# Patient Record
Sex: Female | Born: 1995 | Race: Black or African American | Hispanic: No | Marital: Single | Smoking: Never smoker
Health system: Southern US, Community
[De-identification: ages and names within clinical notes are randomized; demographics above are authoritative.]

## PROBLEM LIST (undated history)

## (undated) DIAGNOSIS — R011 Cardiac murmur, unspecified: Secondary | ICD-10-CM

## (undated) DIAGNOSIS — M1990 Unspecified osteoarthritis, unspecified site: Secondary | ICD-10-CM

## (undated) HISTORY — PX: WISDOM TOOTH EXTRACTION: SHX21

---
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
## 2014-04-07 ENCOUNTER — Encounter (HOSPITAL_COMMUNITY): Payer: Self-pay | Admitting: Emergency Medicine

## 2014-04-07 ENCOUNTER — Emergency Department (HOSPITAL_COMMUNITY)
Admission: EM | Admit: 2014-04-07 | Discharge: 2014-04-07 | Disposition: A | Discharge status: Home / Self Care | Payer: No Typology Code available for payment source | Attending: Emergency Medicine | Admitting: Emergency Medicine

## 2014-04-07 DIAGNOSIS — Z3202 Encounter for pregnancy test, result negative: Secondary | ICD-10-CM | POA: Insufficient documentation

## 2014-04-07 DIAGNOSIS — Z79899 Other long term (current) drug therapy: Secondary | ICD-10-CM | POA: Insufficient documentation

## 2014-04-07 DIAGNOSIS — R011 Cardiac murmur, unspecified: Secondary | ICD-10-CM | POA: Insufficient documentation

## 2014-04-07 DIAGNOSIS — Z8739 Personal history of other diseases of the musculoskeletal system and connective tissue: Secondary | ICD-10-CM | POA: Insufficient documentation

## 2014-04-07 DIAGNOSIS — R109 Unspecified abdominal pain: Secondary | ICD-10-CM

## 2014-04-07 HISTORY — DX: Unspecified osteoarthritis, unspecified site: M19.90

## 2014-04-07 HISTORY — DX: Cardiac murmur, unspecified: R01.1

## 2014-04-07 LAB — CBC WITH DIFFERENTIAL/PLATELET
BASOS ABS: 0 10*3/uL (ref 0.0–0.1)
BASOS PCT: 0 % (ref 0–1)
EOS PCT: 1 % (ref 0–5)
Eosinophils Absolute: 0.1 10*3/uL (ref 0.0–0.7)
HEMATOCRIT: 36.2 % (ref 36.0–46.0)
Hemoglobin: 12.4 g/dL (ref 12.0–15.0)
Lymphocytes Relative: 23 % (ref 12–46)
Lymphs Abs: 1.8 10*3/uL (ref 0.7–4.0)
MCH: 31.1 pg (ref 26.0–34.0)
MCHC: 34.3 g/dL (ref 30.0–36.0)
MCV: 90.7 fL (ref 78.0–100.0)
MONO ABS: 0.6 10*3/uL (ref 0.1–1.0)
Monocytes Relative: 8 % (ref 3–12)
Neutro Abs: 5.3 10*3/uL (ref 1.7–7.7)
Neutrophils Relative %: 68 % (ref 43–77)
PLATELETS: 226 10*3/uL (ref 150–400)
RBC: 3.99 MIL/uL (ref 3.87–5.11)
RDW: 13.3 % (ref 11.5–15.5)
WBC: 7.9 10*3/uL (ref 4.0–10.5)

## 2014-04-07 LAB — URINALYSIS, ROUTINE W REFLEX MICROSCOPIC
Bilirubin Urine: NEGATIVE
GLUCOSE, UA: NEGATIVE mg/dL
Hgb urine dipstick: NEGATIVE
Ketones, ur: NEGATIVE mg/dL
LEUKOCYTES UA: NEGATIVE
Nitrite: NEGATIVE
PH: 7 (ref 5.0–8.0)
PROTEIN: NEGATIVE mg/dL
SPECIFIC GRAVITY, URINE: 1.021 (ref 1.005–1.030)
Urobilinogen, UA: 1 mg/dL (ref 0.0–1.0)

## 2014-04-07 LAB — COMPREHENSIVE METABOLIC PANEL
ALBUMIN: 4.1 g/dL (ref 3.5–5.2)
ALT: 14 U/L (ref 0–35)
AST: 18 U/L (ref 0–37)
Alkaline Phosphatase: 37 U/L — ABNORMAL LOW (ref 39–117)
BUN: 9 mg/dL (ref 6–23)
CALCIUM: 9.5 mg/dL (ref 8.4–10.5)
CO2: 26 mEq/L (ref 19–32)
CREATININE: 0.7 mg/dL (ref 0.50–1.10)
Chloride: 104 mEq/L (ref 96–112)
GFR calc Af Amer: >90 mL/min (ref >90)
Glucose, Bld: 98 mg/dL (ref 70–99)
Potassium: 4.4 mEq/L (ref 3.7–5.3)
SODIUM: 140 meq/L (ref 137–147)
TOTAL PROTEIN: 7.2 g/dL (ref 6.0–8.3)
Total Bilirubin: 0.3 mg/dL (ref 0.3–1.2)

## 2014-04-07 LAB — PREGNANCY, URINE: Preg Test, Ur: NEGATIVE

## 2014-04-07 MED ORDER — IBUPROFEN 600 MG PO TABS: 600.0000 mg | ORAL_TABLET | Freq: Four times a day (QID) | ORAL | Status: AC | PRN

## 2014-04-07 MED ORDER — METHOCARBAMOL 500 MG PO TABS: 500.0000 mg | ORAL_TABLET | Freq: Two times a day (BID) | ORAL | Status: AC

## 2014-04-07 NOTE — Discharge Instructions (Signed)
Abdominal Pain, Adult °Many things can cause abdominal pain. Usually, abdominal pain is not caused by a disease and will improve without treatment. It can often be observed and treated at home. Your health care provider will do a physical exam and possibly order blood tests and X-rays to help determine the seriousness of your pain. However, in many cases, more time must pass before a clear cause of the pain can be found. Before that point, your health care provider may not know if you need more testing or further treatment. °HOME CARE INSTRUCTIONS  °Monitor your abdominal pain for any changes. The following actions may help to alleviate any discomfort you are experiencing: °· Only take over-the-counter or prescription medicines as directed by your health care provider. °· Do not take laxatives unless directed to do so by your health care provider. °· Try a clear liquid diet (broth, tea, or water) as directed by your health care provider. Slowly move to a bland diet as tolerated. °SEEK MEDICAL CARE IF: °· You have unexplained abdominal pain. °· You have abdominal pain associated with nausea or diarrhea. °· You have pain when you urinate or have a bowel movement. °· You experience abdominal pain that wakes you in the night. °· You have abdominal pain that is worsened or improved by eating food. °· You have abdominal pain that is worsened with eating fatty foods. °SEEK IMMEDIATE MEDICAL CARE IF:  °· Your pain does not go away within 2 hours. °· You have a fever. °· You keep throwing up (vomiting). °· Your pain is felt only in portions of the abdomen, such as the right side or the left lower portion of the abdomen. °· You pass bloody or black tarry stools. °MAKE SURE YOU: °· Understand these instructions.   °· Will watch your condition.   °· Will get help right away if you are not doing well or get worse.   °Document Released: 08/23/2005 Document Revised: 09/03/2013 Document Reviewed: 07/23/2013 °[employer]® Patient  Information ©2014 [employer]. ° °

## 2014-04-07 NOTE — ED Notes (Signed)
Pt c.o abd pain x 2 weeks, states it started on the lower right side went up beside umbilicus. Pt states at times when she stands up it makes her right leg numb.

## 2014-04-07 NOTE — ED Provider Notes (Signed)
CSN: [id number]     Arrival date & time 04/07/14  1525 History   First MD Initiated Contact with Patient 04/07/14 1727     Chief Complaint  Patient presents with  . Abdominal Pain     (Consider location/radiation/quality/duration/timing/severity/associated sxs/prior Treatment) Patient is a 18 y.o. female presenting with abdominal pain. The history is provided by the patient.  Abdominal Pain  patient here complaining of right-sided abdominal pain x3 weeks. Pain characterized as sharp and worse with movement. Pain radiates down to her right leg. Does not have any associated vaginal bleeding or discharge. No urinary symptoms. No fever or chills. Denies any rashes to her flank area. No prior history of same. His over-the-counter medications without relief. Pain is better with remaining still. No change in her symptoms currently.  Past Medical History  Diagnosis Date  . Arthritis   . Heart murmur    Past Surgical History  Procedure Laterality Date  . Wisdom tooth extraction     No family history on file. History  Substance Use Topics  . Smoking status: Never Smoker   . Smokeless tobacco: Not on file  . Alcohol Use: No   OB History   Grav Para Term Preterm Abortions TAB SAB Ect Mult Living                 Review of Systems  Gastrointestinal: Positive for abdominal pain.  All other systems reviewed and are negative.     Allergies  Review of patient's allergies indicates no known allergies.  Home Medications   Prior to Admission medications   Medication Sig Start Date End Date Taking? Authorizing Provider  CRANBERRY EXTRACT PO Take 1 tablet by mouth 2 (two) times daily. [facility] brand   Yes Historical Provider, MD   BP 115/74  Pulse 81  Temp(Src) 98.6 F (37 C) (Oral)  Resp 20  SpO2 100%  LMP 03/29/2014 Physical Exam  Nursing note and vitals reviewed. Constitutional: She is oriented to person, place, and time. She appears well-developed and well-nourished.   Non-toxic appearance. No distress.  HENT:  Head: Normocephalic and atraumatic.  Eyes: Conjunctivae, EOM and lids are normal. Pupils are equal, round, and reactive to light.  Neck: Normal range of motion. Neck supple. No tracheal deviation present. No mass present.  Cardiovascular: Normal rate, regular rhythm and normal heart sounds.  Exam reveals no gallop.   No murmur heard. Pulmonary/Chest: Effort normal and breath sounds normal. No stridor. No respiratory distress. She has no decreased breath sounds. She has no wheezes. She has no rhonchi. She has no rales.  Abdominal: Soft. Normal appearance and bowel sounds are normal. She exhibits no distension. There is no tenderness. There is no rigidity, no rebound, no guarding and no CVA tenderness.  Musculoskeletal: Normal range of motion. She exhibits no edema and no tenderness.  Neurological: She is alert and oriented to person, place, and time. She has normal strength. No cranial nerve deficit or sensory deficit. GCS eye subscore is 4. GCS verbal subscore is 5. GCS motor subscore is 6.  Skin: Skin is warm and dry. No abrasion and no rash noted.  Psychiatric: She has a normal mood and affect. Her speech is normal and behavior is normal.    ED Course  Procedures (including critical care time) Labs Review Labs Reviewed  COMPREHENSIVE METABOLIC PANEL - Abnormal; Notable for the following:    Alkaline Phosphatase 37 (*)    All other components within normal limits  CBC WITH DIFFERENTIAL  PREGNANCY,  URINE  URINALYSIS, ROUTINE W REFLEX MICROSCOPIC    Imaging Review No results found.   EKG Interpretation None      MDM   Final diagnoses:  None    Patient without evidence of abdominal pain on exam. She has had symptoms x3 weeks which are positional. Suspect musculoskeletal etiology. No concern for torsion or appendicitis. We'll treat with NSAIDs and muscle relaxants    Toy BakerAnthony T Gregrey Bloyd, MD 04/07/14 1759

## 2017-04-02 ENCOUNTER — Encounter (HOSPITAL_COMMUNITY): Payer: Self-pay | Admitting: *Deleted

## 2017-04-02 ENCOUNTER — Ambulatory Visit (HOSPITAL_COMMUNITY)
Admission: EM | Admit: 2017-04-02 | Discharge: 2017-04-02 | Disposition: A | Discharge status: Home / Self Care | Payer: 59 | Attending: Internal Medicine | Admitting: Internal Medicine

## 2017-04-02 DIAGNOSIS — R110 Nausea: Secondary | ICD-10-CM

## 2017-04-02 DIAGNOSIS — R011 Cardiac murmur, unspecified: Secondary | ICD-10-CM | POA: Diagnosis not present

## 2017-04-02 DIAGNOSIS — R1032 Left lower quadrant pain: Secondary | ICD-10-CM | POA: Insufficient documentation

## 2017-04-02 DIAGNOSIS — M1990 Unspecified osteoarthritis, unspecified site: Secondary | ICD-10-CM | POA: Diagnosis not present

## 2017-04-02 DIAGNOSIS — R109 Unspecified abdominal pain: Secondary | ICD-10-CM | POA: Diagnosis present

## 2017-04-02 LAB — POCT URINALYSIS DIP (DEVICE)
Glucose, UA: 500 mg/dL — AB
KETONES UR: 40 mg/dL — AB
Nitrite: POSITIVE — AB
PH: 5 (ref 5.0–8.0)
Protein, ur: >=300 mg/dL — AB
Urobilinogen, UA: >=8 mg/dL (ref 0.0–1.0)

## 2017-04-02 MED ORDER — CEPHALEXIN 500 MG PO CAPS: 500.0000 mg | ORAL_CAPSULE | Freq: Four times a day (QID) | ORAL | 0 refills | Status: AC

## 2017-04-02 NOTE — Discharge Instructions (Signed)
Return if any problems.

## 2017-04-02 NOTE — ED Triage Notes (Addendum)
Pain  Low     abd  Pt  Is  On  Her   menstural   Cycle      Pain is   Worse  On  Palpation       symptoms  X  2-3  Days     denys   Any  Urinary  Symptoms    Pt has    Implant  In  Her  l   Arm

## 2017-04-02 NOTE — ED Provider Notes (Signed)
CSN: [id number]     Arrival date & time 04/02/17  1934 History   None    Chief Complaint  Patient presents with  . Abdominal Pain   (Consider location/radiation/quality/duration/timing/severity/associated sxs/prior Treatment) The history is provided by the patient. No language interpreter was used.  Abdominal Pain  Pain location:  Generalized Pain quality: aching   Pain radiates to:  LLQ Pain severity:  Moderate Onset quality:  Gradual Timing:  Constant Progression:  Worsening Chronicity:  New Relieved by:  Nothing Worsened by:  Nothing Ineffective treatments:  None tried Associated symptoms: nausea   Risk factors: not pregnant    Pt complains of left lower abdominal pain.  No vaginal discharge no std risk Past Medical History:  Diagnosis Date  . Arthritis   . Heart murmur    Past Surgical History:  Procedure Laterality Date  . WISDOM TOOTH EXTRACTION     History reviewed. No pertinent family history. Social History  Substance Use Topics  . Smoking status: Never Smoker  . Smokeless tobacco: Never Used  . Alcohol use Yes   OB History    No data available     Review of Systems  Gastrointestinal: Positive for abdominal pain and nausea.  All other systems reviewed and are negative.   Allergies  Patient has no known allergies.  Home Medications   Prior to Admission medications   Medication Sig Start Date End Date Taking? Authorizing Provider  cephALEXin (KEFLEX) 500 MG capsule Take 1 capsule (500 mg total) by mouth 4 (four) times daily. 04/02/17   [name], PA-C  CRANBERRY EXTRACT PO Take 1 tablet by mouth 2 (two) times daily. [facility] brand    [provider]  ibuprofen (ADVIL,MOTRIN) 600 MG tablet Take 1 tablet (600 mg total) by mouth every 6 (six) hours as needed. 04/07/14   Lorre Nick, MD  methocarbamol (ROBAXIN) 500 MG tablet Take 1 tablet (500 mg total) by mouth 2 (two) times daily. 04/07/14   Lorre Nick, MD   Meds Ordered and  Administered this Visit  Medications - No data to display  BP 135/75 (BP Location: Left Arm)   Pulse 97   Temp 98.5 F (36.9 C) (Oral)   Resp 16   LMP 04/02/2017 Comment: implant    SpO2 100%  No data found.   Physical Exam  Constitutional: She appears well-developed and well-nourished. No distress.  HENT:  Head: Normocephalic and atraumatic.  Eyes: Conjunctivae are normal.  Neck: Neck supple.  Cardiovascular: Normal rate and regular rhythm.   No murmur heard. Pulmonary/Chest: Effort normal and breath sounds normal. No respiratory distress.  Abdominal: Soft. There is no tenderness.  Musculoskeletal: She exhibits no edema.  Neurological: She is alert.  Skin: Skin is warm and dry.  Psychiatric: She has a normal mood and affect.  Nursing note and vitals reviewed.   Urgent Care Course     Procedures (including critical care time)  Labs Review Labs Reviewed  POCT URINALYSIS DIP (DEVICE) - Abnormal; Notable for the following:       Result Value   Glucose, UA 500 (*)    Bilirubin Urine MODERATE (*)    Ketones, ur 40 (*)    Hgb urine dipstick LARGE (*)    Protein, ur >=300 (*)    Nitrite POSITIVE (*)    Leukocytes, UA LARGE (*)    All other components within normal limits  URINE CULTURE    Imaging Review No results found.   Visual Acuity Review  Right  Eye Distance:   Left Eye Distance:   Bilateral Distance:    Right Eye Near:   Left Eye Near:    Bilateral Near:         MDM UA unreliable due to coloring.  Pt is on [facility] cranberry azo.   I will send culture.  Pt started on keflex.    1. Left lower quadrant pain    An After Visit Summary was printed and given to the patient. Meds ordered this encounter  Medications  . cephALEXin (KEFLEX) 500 MG capsule    Sig: Take 1 capsule (500 mg total) by mouth 4 (four) times daily.    Dispense:  28 capsule    Refill:  0    Order Specific Question:   Supervising Provider    Answer:   Eustace MooreMURRAY, LAURA W [[id number]]   . cephALEXin (KEFLEX) 500 MG capsule    Sig: Take 1 capsule (500 mg total) by mouth 4 (four) times daily.    Dispense:  28 capsule    Refill:  0    Order Specific Question:   Supervising Provider    Answer:   Eustace MooreMURRAY, LAURA W [[id number]]      Elson AreasSofia, [name], PA-C 04/02/17 2047

## 2017-04-04 LAB — URINE CULTURE: Culture: NO GROWTH

## 2024-12-01 ENCOUNTER — Encounter (HOSPITAL_COMMUNITY): Payer: Self-pay

## 2024-12-01 ENCOUNTER — Other Ambulatory Visit: Payer: Self-pay

## 2024-12-01 ENCOUNTER — Emergency Department (HOSPITAL_COMMUNITY)
Admission: EM | Admit: 2024-12-01 | Discharge: 2024-12-01 | Disposition: A | Discharge status: Home / Self Care | Payer: Self-pay | Attending: Emergency Medicine | Admitting: Emergency Medicine

## 2024-12-01 DIAGNOSIS — R718 Other abnormality of red blood cells: Secondary | ICD-10-CM | POA: Insufficient documentation

## 2024-12-01 DIAGNOSIS — E876 Hypokalemia: Secondary | ICD-10-CM | POA: Insufficient documentation

## 2024-12-01 DIAGNOSIS — Z3A01 Less than 8 weeks gestation of pregnancy: Secondary | ICD-10-CM | POA: Insufficient documentation

## 2024-12-01 DIAGNOSIS — D72829 Elevated white blood cell count, unspecified: Secondary | ICD-10-CM | POA: Insufficient documentation

## 2024-12-01 DIAGNOSIS — O219 Vomiting of pregnancy, unspecified: Secondary | ICD-10-CM | POA: Insufficient documentation

## 2024-12-01 DIAGNOSIS — R519 Headache, unspecified: Secondary | ICD-10-CM | POA: Insufficient documentation

## 2024-12-01 DIAGNOSIS — O99281 Endocrine, nutritional and metabolic diseases complicating pregnancy, first trimester: Secondary | ICD-10-CM | POA: Insufficient documentation

## 2024-12-01 LAB — CBC
HCT: 42.2 % (ref 36.0–46.0)
Hemoglobin: 15.5 g/dL — ABNORMAL HIGH (ref 12.0–15.0)
MCH: 32.5 pg (ref 26.0–34.0)
MCHC: 36.7 g/dL — ABNORMAL HIGH (ref 30.0–36.0)
MCV: 88.5 fL (ref 80.0–100.0)
Platelets: 227 K/uL (ref 150–400)
RBC: 4.77 MIL/uL (ref 3.87–5.11)
RDW: 11.3 % — ABNORMAL LOW (ref 11.5–15.5)
WBC: 19.1 K/uL — ABNORMAL HIGH (ref 4.0–10.5)
nRBC: 0 % (ref 0.0–0.2)

## 2024-12-01 LAB — URINALYSIS, ROUTINE W REFLEX MICROSCOPIC
Bilirubin Urine: NEGATIVE
Glucose, UA: NEGATIVE mg/dL
Ketones, ur: 80 mg/dL — AB
Leukocytes,Ua: NEGATIVE
Nitrite: NEGATIVE
Protein, ur: >=300 mg/dL — AB
Specific Gravity, Urine: 1.023 (ref 1.005–1.030)
pH: 5 (ref 5.0–8.0)

## 2024-12-01 LAB — COMPREHENSIVE METABOLIC PANEL WITH GFR
ALT: 14 U/L (ref 0–44)
AST: 17 U/L (ref 15–41)
Albumin: 5 g/dL (ref 3.5–5.0)
Alkaline Phosphatase: 37 U/L — ABNORMAL LOW (ref 38–126)
Anion gap: 20 — ABNORMAL HIGH (ref 5–15)
BUN: 9 mg/dL (ref 6–20)
CO2: 22 mmol/L (ref 22–32)
Calcium: 10.5 mg/dL — ABNORMAL HIGH (ref 8.9–10.3)
Chloride: 95 mmol/L — ABNORMAL LOW (ref 98–111)
Creatinine, Ser: 0.76 mg/dL (ref 0.44–1.00)
GFR, Estimated: >60 mL/min
Glucose, Bld: 112 mg/dL — ABNORMAL HIGH (ref 70–99)
Potassium: 3.1 mmol/L — ABNORMAL LOW (ref 3.5–5.1)
Sodium: 136 mmol/L (ref 135–145)
Total Bilirubin: 1 mg/dL (ref 0.0–1.2)
Total Protein: 8.3 g/dL — ABNORMAL HIGH (ref 6.5–8.1)

## 2024-12-01 LAB — BASIC METABOLIC PANEL WITH GFR
Anion gap: 16 — ABNORMAL HIGH (ref 5–15)
BUN: 8 mg/dL (ref 6–20)
CO2: 23 mmol/L (ref 22–32)
Calcium: 9.6 mg/dL (ref 8.9–10.3)
Chloride: 98 mmol/L (ref 98–111)
Creatinine, Ser: 0.65 mg/dL (ref 0.44–1.00)
GFR, Estimated: >60 mL/min
Glucose, Bld: 98 mg/dL (ref 70–99)
Potassium: 3.6 mmol/L (ref 3.5–5.1)
Sodium: 137 mmol/L (ref 135–145)

## 2024-12-01 LAB — LIPASE, BLOOD: Lipase: 16 U/L (ref 11–51)

## 2024-12-01 LAB — HCG, SERUM, QUALITATIVE: Preg, Serum: POSITIVE — AB

## 2024-12-01 LAB — HCG, QUANTITATIVE, PREGNANCY: hCG, Beta Chain, Quant, S: 109492 m[IU]/mL — ABNORMAL HIGH

## 2024-12-01 MED ORDER — ACETAMINOPHEN 325 MG PO TABS
650.0000 mg | ORAL_TABLET | Freq: Once | ORAL | Status: AC
  Administered 2024-12-01: 650 mg via ORAL
  Filled 2024-12-01: qty 2

## 2024-12-01 MED ORDER — ONDANSETRON HCL 4 MG/2ML IJ SOLN
4.0000 mg | Freq: Once | INTRAMUSCULAR | Status: AC
  Administered 2024-12-01: 4 mg via INTRAVENOUS
  Filled 2024-12-01: qty 2

## 2024-12-01 MED ORDER — ONDANSETRON HCL 4 MG PO TABS: 4.0000 mg | ORAL_TABLET | Freq: Four times a day (QID) | ORAL | 0 refills | Status: AC

## 2024-12-01 MED ORDER — LACTATED RINGERS IV BOLUS
1000.0000 mL | Freq: Once | INTRAVENOUS | Status: AC
  Administered 2024-12-01: 1000 mL via INTRAVENOUS

## 2024-12-01 MED ORDER — ONDANSETRON 4 MG PO TBDP
4.0000 mg | ORAL_TABLET | Freq: Once | ORAL | Status: AC
  Administered 2024-12-01: 4 mg via ORAL
  Filled 2024-12-01: qty 1

## 2024-12-01 MED ORDER — POTASSIUM CHLORIDE 10 MEQ/100ML IV SOLN
10.0000 meq | INTRAVENOUS | Status: AC
  Administered 2024-12-01 (×2): 10 meq via INTRAVENOUS
  Filled 2024-12-01 (×2): qty 100

## 2024-12-01 NOTE — Discharge Instructions (Addendum)
 Your nausea and vomiting is likely due to your new positive pregnancy test.  You received fluids, antinausea medication (Zofran ), and Tylenol .  Because of the nausea and vomiting you have been having, one of your electrolytes was low (potassium), it was repleted. On recheck, it was within normal limits.  Please follow-up with your OB/GYN and establish with a primary care provider.  Please return to the ED if you have any worsening of your symptoms, new vaginal bleeding, or severe abdominal cramping.  You are being discharged with Zofran  4 mg disintegrating tablets; use every 6 hours as needed for nausea.

## 2024-12-01 NOTE — ED Provider Notes (Signed)
 " Shawn[facility] Lara EMERGENCY DEPARTMENT AT [facility] Provider Note   CSN: [id number] Arrival date & time: 12/01/24  9172     Patient presents with: Dizziness and Emesis   Shawna Lara is a 29 y.o. female.   Shawna Lara is a 29 year old female with no significant past medical history found in chart review, however reports heart murmur diagnosed in middle or high school.  No primary care doctor.  Patient presents to the emergency department for lightheadedness, nausea/vomiting that began on Sunday.  No vomiting began as occurring and is now clear.  She stated that her family brought her food but she was unable to eat or drink.  She denies any abdominal pain, body aches, fatigue, chest pain, diarrhea, constipation, palpitations, change in vision, dysuria or increased urinary frequency.  She endorses feeling subjective fever 1/4, sweating, headache associated with vomiting, lightheadedness worsening with movement, and shortness of breath on exertion.  Her last menstrual period was end of November, however she reports they are irregular.  She had Nexplanon placed but she is unsure when.  She denies any bleeding.  Patient denies any recent alcohol use but drinks socially, stated that she has occasional marijuana use though has not smoked for a while and does not do it every day.  Denies tobacco smoking history.   Dizziness Associated symptoms: vomiting   Emesis     Prior to Admission medications  Medication Sig Start Date End Date Taking? Authorizing Provider  cephALEXin  (KEFLEX ) 500 MG capsule Take 1 capsule (500 mg total) by mouth 4 (four) times daily. 04/02/17   [name], Leslie K, PA-C  cephALEXin  (KEFLEX ) 500 MG capsule Take 1 capsule (500 mg total) by mouth 4 (four) times daily. 04/02/17   [name], Leslie K, PA-C  CRANBERRY EXTRACT PO Take 1 tablet by mouth 2 (two) times daily. [facility] brand    [provider]  ibuprofen  (ADVIL ,MOTRIN ) 600 MG tablet Take 1 tablet (600 mg  total) by mouth every 6 (six) hours as needed. 04/07/14   Dasie Faden, MD  methocarbamol  (ROBAXIN ) 500 MG tablet Take 1 tablet (500 mg total) by mouth 2 (two) times daily. 04/07/14   Dasie Faden, MD    Allergies: Patient has no known allergies.    Review of Systems  Gastrointestinal:  Positive for vomiting.  Neurological:  Positive for dizziness.    Updated Vital Signs BP (!) 130/106 (BP Location: Left Arm)   Pulse (!) 127   Temp 98.2 F (36.8 C) (Oral)   Resp 18   Ht 5' 11 (1.803 m)   Wt 65.8 kg   LMP 10/24/2024   SpO2 100%   BMI 20.22 kg/m   Physical Exam Constitutional:      General: She is not in acute distress.    Appearance: She is normal weight. She is ill-appearing. She is not toxic-appearing.  HENT:     Mouth/Throat:     Mouth: Mucous membranes are dry.  Cardiovascular:     Rate and Rhythm: Tachycardia present.     Heart sounds: Normal heart sounds. No murmur heard.    No friction rub. No gallop.  Pulmonary:     Effort: Pulmonary effort is normal.     Breath sounds: Normal breath sounds. No stridor. No wheezing, rhonchi or rales.  Abdominal:     General: Abdomen is flat.     Palpations: Abdomen is soft.     Tenderness: There is no abdominal tenderness. There is no guarding or rebound.  Musculoskeletal:  Right lower leg: No edema.     Left lower leg: No edema.  Skin:    Comments: Normal skin turgor  Neurological:     Mental Status: She is alert.     (all labs ordered are listed, but only abnormal results are displayed) Labs Reviewed  COMPREHENSIVE METABOLIC PANEL WITH GFR - Abnormal; Notable for the following components:      Result Value   Potassium 3.1 (*)    Chloride 95 (*)    Glucose, Bld 112 (*)    Calcium 10.5 (*)    Total Protein 8.3 (*)    Alkaline Phosphatase 37 (*)    Anion gap 20 (*)    All other components within normal limits  CBC - Abnormal; Notable for the following components:   WBC 19.1 (*)    Hemoglobin 15.5 (*)     MCHC 36.7 (*)    RDW 11.3 (*)    All other components within normal limits  HCG, SERUM, QUALITATIVE - Abnormal; Notable for the following components:   Preg, Serum POSITIVE (*)    All other components within normal limits  LIPASE, BLOOD  URINALYSIS, ROUTINE W REFLEX MICROSCOPIC  HCG, QUANTITATIVE, PREGNANCY    EKG: EKG Interpretation Date/Time:  Monday December 01 2024 08:34:34 EST Ventricular Rate:  132 PR Interval:  129 QRS Duration:  143 QT Interval:  410 QTC Calculation: 608 R Axis:   86  Text Interpretation: Sinus tachycardia Consider right atrial enlargement Nonspecific intraventricular conduction delay Confirmed by Cottie Cough [phone]) on 12/01/2024 8:52:35 AM  Radiology: No results found.   Procedures   Medications Ordered in the ED  lactated ringers  bolus 1,000 mL (has no administration in time range)  ondansetron  (ZOFRAN -ODT) disintegrating tablet 4 mg (has no administration in time range)  acetaminophen  (TYLENOL ) tablet 650 mg (has no administration in time range)    Clinical Course as of 12/01/24 1244  Mon Dec 01, 2024  0935 This is a 29 year old female, with no significant medical history, presenting to the ED with about 1 week of nausea, vomiting, inability to tolerate p.o.  She reports mild headaches.  Denies any cough, congestion, fevers.  She reports her last menstrual cycle was at the end of November, although her menses have been irregular.  She used to see OB/GYN many years ago and had a Nexplanon implant or other birth control type implant in her left arm, but thinks it might be expired, does not remember when it was placed.  Her workup and labs are most notable for positive pregnancy test, also appears to be dehydrated with concentrated urine, ketones and protein, anion gap of 20, concentrated CBC.  She is also mildly hypokalemic.  I suspect this is all related to nausea and poor intake from early pregnancy.  She is not having any lower abdominal cramping or  bleeding to raise immediate concern for spontaneous abortion.  Patient will be given IV fluids, Zofran , Tylenol , potassium.  Discussed about potential risks of these medications early pregnancy, which I believe are quite minimal, and that she is needing these medicines for her overall health.  She will need to reestablish care with OB/GYN, whom she has seen in the past.  We will proceed with these medications and recheck a BMP to ensure that anion gap is improving, prior to consideration of discharge, as well as needing p.o. challenge.  Otherwise she is stable.  Low suspicion for acute appendicitis, PID, other life-threatening infection or intra-abdominal surgical emergency. [MT]  1018 Hcg  level elevated but consistent with possible [redacted] weeks gestation from her LMP.  With no risk factors or abdominal cramping/pain, I have a lower suspicion for an ectopic pregnancy.  I think she can trend these levels with her OBGYN and get regular pregnancy ultrasound as an outpatient as indicated. [MT]  1242 Anion gap and K levels improved on repeat check. Will PO challenge and anticipate likely discharge if successful [MT]    Clinical Course User Index [MT] Trifan, Donnice PARAS, MD                                 Medical Decision Making This patient is a 29 y.o. female who presents to the ED for concern of intractable nausea and vomiting and lightheadedness.  Differential diagnosis includes but is not limited to: Appendicitis, pancreatitis, cyclical vomiting syndrome, presyncope, gastritis.   Past Medical History / Co-morbidities / Social History: Reported heart murmur diagnosed in middle or high school. Reported Nexplanon insertion although unsure when  Physical Exam: Physical exam performed. The pertinent findings include:  Cardiac murmur not appreciated.  Normal skin turgor.  Tachycardic.  Lab Tests: I ordered, and personally interpreted labs.  The pertinent results include:   - hCG: Positive - hCG  quantitative: 109, 492 - Urinalysis: Positive for ketones, rare bacteria, proteinuria >300 - Lipase: Unremarkable - CBC: Leukocytosis at 19.1, hemoglobin 15.5 - CMP: Potassium 3.1, chloride 95, anion gap 20 - Repeat BMP: Potassium 3.6, chloride 98, CO2 23, anion gap 16  EKG/Cardiac Monitoring:  My attending physician Dr. Cottie viewed and interpreted the EKG which showed an underlying rhythm of: Sinus tachycardia. I agree with this interpretation.   Medications: I ordered medication including - Tylenol  for headache - Zofran  for nausea - LR 1 L bolus - Potassium IV 10 mg/h for 2 hours   MDM: Patient presented for intractable nausea and vomiting and lightheadedness ongoing for the past couple days.  Urine pregnancy test was positive with quantitative hCG 109,497.  She stated her last menstrual period was late November, however she has irregular menses.  She had Nexplanon insertion but she is not sure when that was that she thinks is expired.  Patient was not complaining of any abdominal pain or vaginal bleeding.  Low suspicion for molar pregnancy at this time given asymptomatic abdominal findings.  Regarding other possible differential diagnoses for intractable nausea and vomiting, it is unlikely the patient has other intra-abdominal cause such as appendicitis, pancreatitis, gastritis or cyclical vomiting syndrome/cannabinoid hyperemesis syndrome.  Urinalysis positive for rare bacteria, ketones, proteinuria.  Patient denied any urinary symptoms.  Patient had some electrolyte abnormalities with potassium 3.1 and anion gap metabolic acidosis.  Anion gap likely 2/2 intractable nausea/vomiting/starvation ketosis.  Potassium was repleted, LR given, Repeat BMP: Potassium 3.6, chloride 98, CO2 23, anion gap 16.  Patient was encouraged to follow-up with woman's care/OBGYN regarding pregnancy.  Return precautions were discussed.  Patient was discharged after receiving IV Zofran  and tolerating p.o.  Patient  had no further questions before discharge.  Blood pressure elevated during encounter 130s over 80s and urinalysis positive for proteinuria.  Recommend following up outpatient with primary care provider.  I discussed this case with my attending physician Dr. Cottie who cosigned this note including patient's presenting symptoms, physical exam, and planned diagnostics and interventions. Attending physician stated agreement with plan or made changes to plan which were implemented.      Amount and/or Complexity of  Data Reviewed Labs: ordered.  Risk OTC drugs. Prescription drug management.       Final diagnoses:  None    ED Discharge Orders     None          Benuel Braun, DO 12/01/24 1328    Cottie Donnice PARAS, MD 12/01/24 1705  "

## 2024-12-01 NOTE — ED Triage Notes (Signed)
 Pt came c/o lightheadedness. N&V started last Sunday. 12/28 Unable to eat/drink. Denies any pain. Pt currently tachy. Last menstrual cycle - End of November
